# Patient Record
Sex: Female | Born: 1995 | Race: Black or African American | Hispanic: No | Marital: Single | Smoking: Never smoker
Health system: Southern US, Community
[De-identification: ages and names within clinical notes are randomized; demographics above are authoritative.]

---

## 2016-11-21 ENCOUNTER — Encounter: Payer: Self-pay | Admitting: Emergency Medicine

## 2016-11-21 ENCOUNTER — Emergency Department
Admission: EM | Admit: 2016-11-21 | Discharge: 2016-11-21 | Disposition: A | Discharge status: Home / Self Care | Payer: No Typology Code available for payment source | Attending: Emergency Medicine | Admitting: Emergency Medicine

## 2016-11-21 ENCOUNTER — Emergency Department: Payer: No Typology Code available for payment source

## 2016-11-21 DIAGNOSIS — S161XXA Strain of muscle, fascia and tendon at neck level, initial encounter: Secondary | ICD-10-CM | POA: Insufficient documentation

## 2016-11-21 DIAGNOSIS — S29019A Strain of muscle and tendon of unspecified wall of thorax, initial encounter: Secondary | ICD-10-CM

## 2016-11-21 DIAGNOSIS — S199XXA Unspecified injury of neck, initial encounter: Secondary | ICD-10-CM | POA: Diagnosis present

## 2016-11-21 DIAGNOSIS — M62830 Muscle spasm of back: Secondary | ICD-10-CM

## 2016-11-21 DIAGNOSIS — Y9389 Activity, other specified: Secondary | ICD-10-CM | POA: Insufficient documentation

## 2016-11-21 DIAGNOSIS — Y999 Unspecified external cause status: Secondary | ICD-10-CM | POA: Diagnosis not present

## 2016-11-21 DIAGNOSIS — Y92410 Unspecified street and highway as the place of occurrence of the external cause: Secondary | ICD-10-CM | POA: Diagnosis not present

## 2016-11-21 DIAGNOSIS — S29012A Strain of muscle and tendon of back wall of thorax, initial encounter: Secondary | ICD-10-CM | POA: Insufficient documentation

## 2016-11-21 DIAGNOSIS — S60052A Contusion of left little finger without damage to nail, initial encounter: Secondary | ICD-10-CM | POA: Diagnosis not present

## 2016-11-21 MED ORDER — KETOROLAC TROMETHAMINE 30 MG/ML IJ SOLN
30.0000 mg | Freq: Once | INTRAMUSCULAR | Status: AC
  Administered 2016-11-21: 30 mg via INTRAMUSCULAR
  Filled 2016-11-21: qty 1

## 2016-11-21 MED ORDER — ORPHENADRINE CITRATE 30 MG/ML IJ SOLN
60.0000 mg | Freq: Two times a day (BID) | INTRAMUSCULAR | Status: DC
  Administered 2016-11-21: 60 mg via INTRAMUSCULAR
  Filled 2016-11-21: qty 2

## 2016-11-21 MED ORDER — CYCLOBENZAPRINE HCL 10 MG PO TABS: 10.0000 mg | ORAL_TABLET | Freq: Three times a day (TID) | ORAL | 0 refills | Status: AC | PRN

## 2016-11-21 MED ORDER — NAPROXEN 500 MG PO TABS: 500.0000 mg | ORAL_TABLET | Freq: Two times a day (BID) | ORAL | 0 refills | Status: AC
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
